# Patient Record
Sex: Male | Born: 1995 | Race: White | Hispanic: No | Marital: Single | Smoking: Never smoker
Health system: Southern US, Community
[De-identification: ages and names within clinical notes are randomized; demographics above are authoritative.]

---
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
## 2015-03-12 ENCOUNTER — Encounter (HOSPITAL_COMMUNITY): Payer: Self-pay | Admitting: Emergency Medicine

## 2015-03-12 ENCOUNTER — Emergency Department (HOSPITAL_COMMUNITY)
Admission: EM | Admit: 2015-03-12 | Discharge: 2015-03-12 | Disposition: A | Discharge status: Home / Self Care | Payer: Self-pay | Source: Home / Self Care | Attending: Family Medicine | Admitting: Family Medicine

## 2015-03-12 DIAGNOSIS — M7551 Bursitis of right shoulder: Secondary | ICD-10-CM

## 2015-03-12 MED ORDER — IBUPROFEN 600 MG PO TABS: 600.0000 mg | ORAL_TABLET | Freq: Three times a day (TID) | ORAL | Status: DC

## 2015-03-12 NOTE — ED Provider Notes (Signed)
CSN: [id number]     Arrival date & time 03/12/15  1014 History   First MD Initiated Contact with Patient 03/12/15 1142     Chief Complaint  Patient presents with  . Shoulder Pain   (Consider location/radiation/quality/duration/timing/severity/associated sxs/prior Treatment) HPI Comments: Unemployed Otherwise healthy Has tried occasional dose of ibuprofen with some relief PCP: [facility]. Has not been evaluated by PCP Spends 1-2 hours each day playing basketball either at home or with league.   Patient is a 19 y.o. male presenting with shoulder pain. The history is provided by the patient.  Shoulder Pain Location:  Shoulder Time since incident:  3 weeks Injury: no   Shoulder location:  R shoulder Chronicity:  New Handedness:  Right-handed Dislocation: no   Prior injury to area:  No Ineffective treatments:  None tried Associated symptoms comment:  None   History reviewed. No pertinent past medical history. History reviewed. No pertinent past surgical history. No family history on file. History  Substance Use Topics  . Smoking status: Never Smoker   . Smokeless tobacco: Not on file  . Alcohol Use: No    Review of Systems  All other systems reviewed and are negative.   Allergies  Review of patient's allergies indicates no known allergies.  Home Medications   Prior to Admission medications   Medication Sig Start Date End Date Taking? Authorizing Provider  ibuprofen (ADVIL,MOTRIN) 600 MG tablet Take 1 tablet (600 mg total) by mouth 3 (three) times daily. With meals for the next 7 days and then every 8 hours as needed for pain 03/12/15   Jess BartersJennifer Lee H Presson, PA   BP 118/79 mmHg  Pulse 88  Temp(Src) 98.6 F (37 C) (Oral)  Resp 22  SpO2 99% Physical Exam  Constitutional: He is oriented to person, place, and time. He appears well-developed and well-nourished. No distress.  HENT:  Head: Normocephalic and atraumatic.  Eyes: Conjunctivae are normal. No scleral icterus.   Neck: Normal range of motion. Neck supple.  Cardiovascular: Normal rate, regular rhythm and normal heart sounds.   Pulmonary/Chest: Effort normal and breath sounds normal. No respiratory distress. He has no wheezes.  Musculoskeletal: Normal range of motion.       Right shoulder: He exhibits tenderness. He exhibits normal range of motion, no bony tenderness, no swelling, no effusion, no crepitus, no deformity, no laceration, no spasm, normal pulse and normal strength.  +point tenderness at anterior bursa +mild tenderness with overhead reach and lateral abduction CSM exam of RUE intact  Neurological: He is alert and oriented to person, place, and time.  Skin: Skin is warm and dry. No rash noted. No erythema.  Psychiatric: He has a normal mood and affect. His behavior is normal.  Nursing note and vitals reviewed.   ED Course  Procedures (including critical care time) Labs Review Labs Reviewed - No data to display  Imaging Review No results found.   MDM   1. Shoulder bursitis, right    Mild anterior shoulder bursitis. Likely self limited overuse injury that will improve/resolve with RICE and NSAID therapy. Advised using these therapies for 7-10 days and then seeking follow up with PCP if symptoms do not improve.     Ria ClockJennifer Lee H Presson, GeorgiaPA 03/12/15 1300

## 2015-03-12 NOTE — Discharge Instructions (Signed)
Repetitive Strain Injuries  Repetitive strain injuries (RSIs) result from overuse or misuse of soft tissues including muscles, tendons, or nerves. Tendons are the cord-like structures that attach muscles to bones. RSIs can affect almost any part of the body. However, RSIs are most common in the arms (thumbs, wrists, elbows, shoulders) and legs (ankles, knees). Common medical conditions that are often caused by repetitive strain include carpal tunnel syndrome, tennis or golfer's elbow, bursitis, and tendonitis. If RSIs are treated early, and therepeated activity is reduced or removed, the severity and length of your problems can usually be reduced. RSIs are also called cumulative trauma disorders (CTD).   CAUSES   Many RSIs occur due to repeating the same activity at work over weeks or months without sufficient rest, such as prolonged typing. RSIs also commonly occur when a hobby or sport is done repeatedly without sufficient rest. RSIs can also occur due to repeated strain or stress on a body part in someone who has one or more risk factors for RSIs.  RISK FACTORS  Workplace risk factors   Frequent computer use, especially if your workstation is not adjusted for your body type.   Infrequent rest breaks.   Working in a high-pressure environment.   Working at a fast pace.   Repeating the same motion, such as frequent typing.   Working in an awkward position or holding the same position for a long time.   Forceful movements such as lifting, pulling, or pushing.   Vibration caused by using power tools.   Working in cold temperatures.   Job stress.  Personal risk factors   Poor posture.   Being loose-jointed.   Not exercising regularly.   Being overweight.   Arthritis, diabetes, thyroid problems, or other long-term (chronic)medical conditions.   Vitamin deficiencies.   Keeping your fingernails long.   An unhealthy, stressful, or inactive lifestyle.   Not sleeping well.  SYMPTOMS   Symptoms often  begin at work but become more noticeable after the repeated stress has ended. For example, you may develop fatigue or soreness in your wrist while typingat work, and at night you may develop numbness and tingling in your fingers. Common symptoms include:    Burning, shooting, or aching pain, especially in the fingers, palms, wrists, forearms, or shoulders.   Tenderness.   Swelling.   Tingling, numbness, or loss of feeling.   Pain with certain activities, such as turning a doorknob or reaching above your head.   Weakness, heaviness, or loss of coordination in yourhand.   Muscle spasms or tightness.  In some cases, symptoms can become so intense that it is difficult to perform everyday tasks. Symptoms that do not improve with rest may indicate a more serious condition.   DIAGNOSIS   Your caregiver may determine the type ofRSI you have based on your medical evaluation and a description of your activities.   TREATMENT   Treatment depends on the severity and type of RSI you have. Your caregiver may recommend rest for the affected body part, medicines, and physical or occupational therapy to reduce pain, swelling, and soreness. Discuss the activities you do repeatedly with your caregiver. Your caregiver can help you decide whether you need to change your activities. An RSI may take months or years to heal, especially if the affected body part gets insufficient rest. In some cases, such as severe carpal tunnel syndrome, surgery may be recommended.  PREVENTION   Talk with your supervisor to make sure you have the proper equipment   cushion in the curve of your lower back.  Shoulders and arms relaxed and at your sides.  Neck relaxed and not bent forwards or backwards.  Your desk and computer workstation  properly adjusted to your body type.  Your chair adjusted so there is no excess pressure on the back of your thighs.  The keyboard resting above your thighs. You should be able to reach the keys with your elbows at your side, bent at a right angle. Your arms should be supported on forearm rests, with your forearms parallel to the ground.  The computer mouse within easy reach.  The monitor directly in front of you, so that your eyes are aligned with the top of the screen. The screen should be about 15 to 25 inches from your eyes.  While typing, keep your wrist straight, in a neutral position. Move your entire arm when you move your mouse or when typing hard-to-reach keys.  Only use your computer as much as you need to for work. Do not use it during breaks.  Take breaks often from any repeated activity. Alternate with another task which requires you to use different muscles, or rest at least once every hour.  Change positions regularly. If you spend a lot of time sitting, get up, walk around, and stretch.  Do not hold pens or pencils tightly when writing.  Exercise regularly.  Maintain a normal weight.  Eat a diet with plenty of vegetables, whole grains, and fruit.  Get sufficient, restful sleep. HOME CARE INSTRUCTIONS  If your caregiver prescribed medicine to help reduce swelling, take it as directed.  Only take over-the-counter or prescription medicines for pain, discomfort, or fever as directed by your caregiver.  Reduce, and if needed, stopthe activities that are causing your problems until you have no further symptoms.If your symptoms are work-related, you may need to talk to your supervisor about changing your activities.  When symptoms develop, put ice or a cold pack on the aching area.  Put ice in a plastic bag.  Place a towel between your skin and the bag.  Leave the ice on for 15-20 minutes.  If you were given a splint to keep your wrist from bending, wear it as  instructed. It is important to wear the splint at night. Use the splint for as long as your caregiver recommends. SEEK MEDICAL CARE IF:  You develop new problems.  Your problems do not get better with medicine. MAKE SURE YOU:  Understand these instructions.  Will watch your condition.  Will get help right away if you are not doing well or get worse. Document Released: 10/22/2002 Document Revised: 05/02/2012 Document Reviewed: 12/23/2011 [facility] Patient Information 2015 [place], [place]. This information is not intended to replace advice given to you by your health care provider. Make sure you discuss any questions you have with your health care provider.  Rotator Cuff Tendinitis  Rotator cuff tendinitis is inflammation of the tough, cord-like bands that connect muscle to bone (tendons) in your rotator cuff. Your rotator cuff is the collection of all the muscles and tendons that connect your arm to your shoulder. Your rotator cuff holds the head of your upper arm bone (humerus) in the cup (fossa) of your shoulder blade (scapula). CAUSES Rotator cuff tendinitis is usually caused by overusing the joint involved.  SIGNS AND SYMPTOMS  Deep ache in the shoulder also felt on the outside upper arm over the shoulder muscle.  Point tenderness over the area that is injured.  Pain  comes on gradually and becomes worse with lifting the arm to the side (abduction) or turning it inward (internal rotation).  May lead to a chronic tear: When a rotator cuff tendon becomes inflamed, it runs the risk of losing its blood supply, causing some tendon fibers to die. This increases the risk that the tendon can fray and partially or completely tear. DIAGNOSIS Rotator cuff tendinitis is diagnosed by taking a medical history, performing a physical exam, and reviewing results of imaging exams. The medical history is useful to help determine the type of rotator cuff injury. The physical exam will include looking at  the injured shoulder, feeling the injured area, and watching you do range-of-motion exercises. X-ray exams are typically done to rule out other causes of shoulder pain, such as fractures. MRI is the imaging exam usually used for significant shoulder injuries. Sometimes a dye study called CT arthrogram is done, but it is not as widely used as MRI. In some institutions, special ultrasound tests may also be used to aid in the diagnosis. TREATMENT  Less Severe Cases  Use of a sling to rest the shoulder for a short period of time. Prolonged use of the sling can cause stiffness, weakness, and loss of motion of the shoulder joint.  Anti-inflammatory medicines, such as ibuprofen or naproxen sodium, may be prescribed. More Severe Cases  Physical therapy.  Use of steroid injections into the shoulder joint.  Surgery. HOME CARE INSTRUCTIONS   Use a sling or splint until the pain decreases. Prolonged use of the sling can cause stiffness, weakness, and loss of motion of the shoulder joint.  Apply ice to the injured area:  Put ice in a plastic bag.  Place a towel between your skin and the bag.  Leave the ice on for 20 minutes, 2-3 times a day.  Try to avoid use other than gentle range of motion while your shoulder is painful. Use the shoulder and exercise only as directed by your health care provider. Stop exercises or range of motion if pain or discomfort increases, unless directed otherwise by your health care provider.  Only take over-the-counter or prescription medicines for pain, discomfort, or fever as directed by your health care provider.  If you were given a shoulder sling and straps (immobilizer), do not remove it except as directed, or until you see a health care provider for a follow-up exam. If you need to remove it, move your arm as little as possible or as directed.  You may want to sleep on several pillows at night to lessen swelling and pain. SEEK IMMEDIATE MEDICAL CARE IF:    Your shoulder pain increases or new pain develops in your arm, hand, or fingers and is not relieved with medicines.  You have new, unexplained symptoms, especially increased numbness in the hands or loss of strength.  You develop any worsening of the problems that brought you in for care.  Your arm, hand, or fingers are numb or tingling.  Your arm, hand, or fingers are swollen, painful, or turn white or blue. MAKE SURE YOU:  Understand these instructions.  Will watch your condition.  Will get help right away if you are not doing well or get worse. Document Released: 01/22/2004 Document Revised: 08/22/2013 Document Reviewed: 06/13/2013 [facility] Patient Information 2015 [place], [place]. This information is not intended to replace advice given to you by your health care provider. Make sure you discuss any questions you have with your health care provider.  Bursitis Bursitis is inflammation of  a bursa. A bursa is a soft, fluid-filled sac. It cushions the soft tissue around a bone. Bursitis often occurs in the bursas near the shoulders, elbows, knees, pelvis, hips, heel, and Achilles tendon.  SYMPTOMS   Pain and tenderness in the affected area. Sometimes, pain radiates into surrounding areas. Specifically, pain with movement.  Limited range of motion of the affected joint.  Sometimes, painless swelling of the bursa.  Fever (when infected). CAUSES   Injury to a joint or bursa.  Overuse or strenuous exercise of a joint.  Gout (disease with inflamed joints).  Prolonged pressure on a joint containing bursas (resting on an elbow or kneeling).  Arthritis.  Acute or chronic infection.  Calcium deposits in shoulder tendons, with degeneration of the tendon. RISK INCREASES WITH:  Vigorous, repeated, or sudden increase in athletic training or activity level.  Failure to warm up properly.  Overstretching.  Improper exercise technique.  Playing sports on  [place]. PREVENTION  Avoid injuries or overuse of muscles.  Warm up and cool down properly. Do this before and after physical activity.  Maintain proper conditioning:  Joint flexibility.  Muscle strength and endurance.  Cardiovascular fitness.  Learn and use proper technique.  Wear protective equipment. PROGNOSIS  With proper treatment, symptoms often go away within 7 to 14 days.  RELATED COMPLICATIONS   Frequent recurrence of symptoms. This can result in a chronic, repetitive problem.  Joint stiffness.  Limited joint movement.  Infection of bursa.  Chronic inflammation or scarring of bursa. TREATMENT Treatment first involves protecting and resting the bursa and its joint. You may use ice or an elastic bandage to reduce inflammation. Anti-inflammatory medicines may help resolve the swelling. If symptoms persist despite treatment, a caregiver may withdraw fluid from the bursa. They might also consider a corticosteroid injection. Sometimes, bursitis will persist in spite of nonsurgical treatment or will become infected. These cases may require removal (surgical excision) of the bursa.  MEDICATION   If pain medicine is needed, nonsteroidal anti-inflammatory medicines, such as aspirin and ibuprofen, or other minor pain relievers, such as acetaminophen, are often recommended.  Do not take pain medicine for 7 days before surgery.  Prescription pain relievers are usually only prescribed after surgery. Use only as directed and only as much as you need.  Ointments applied to the skin may be helpful.  Corticosteroid injections may be given. This is done to reduce inflammation in the bursa. HEAT AND COLD:  Cold treatment (icing) relieves pain and reduces inflammation. Cold treatment should be applied for 10 to 15 minutes every 2 to 3 hours for inflammation and pain, and immediately after any activity that aggravates your symptoms. Use ice packs or an ice massage.  Heat  treatment may be used prior to performing the stretching and strengthening activities prescribed by your caregiver, physical therapist, or athletic trainer. Use a heat pack or a warm soak. SEEK MEDICAL CARE IF:   Symptoms get worse or do not improve in 2 weeks, despite treatment.  New, unexplained symptoms develop. (Drugs used in treatment may produce side effects.) Document Released: 11/01/2005 Document Revised: 03/18/2014 Document Reviewed: 02/13/2009 [facility] Patient Information 2015 [place], [place]. This information is not intended to replace advice given to you by your health care provider. Make sure you discuss any questions you have with your health care provider.

## 2015-03-12 NOTE — ED Notes (Signed)
C/o right shoulder pain onset 2 weeks Denies inj/trauma, numbness/tingly Pain increases in the am; last for 30 min but feels better as he gets going Alert, no signs of acute distress.

## 2018-12-07 ENCOUNTER — Encounter (HOSPITAL_COMMUNITY): Payer: Self-pay

## 2018-12-07 ENCOUNTER — Ambulatory Visit (INDEPENDENT_AMBULATORY_CARE_PROVIDER_SITE_OTHER): Payer: Self-pay

## 2018-12-07 ENCOUNTER — Other Ambulatory Visit: Payer: Self-pay

## 2018-12-07 ENCOUNTER — Ambulatory Visit (HOSPITAL_COMMUNITY)
Admission: EM | Admit: 2018-12-07 | Discharge: 2018-12-07 | Disposition: A | Discharge status: Home / Self Care | Payer: Self-pay | Attending: Family Medicine | Admitting: Family Medicine

## 2018-12-07 DIAGNOSIS — S61215A Laceration without foreign body of left ring finger without damage to nail, initial encounter: Secondary | ICD-10-CM

## 2018-12-07 MED ORDER — TETANUS-DIPHTH-ACELL PERTUSSIS 5-2.5-18.5 LF-MCG/0.5 IM SUSP
INTRAMUSCULAR | Status: AC
  Filled 2018-12-07: qty 0.5

## 2018-12-07 MED ORDER — TETANUS-DIPHTH-ACELL PERTUSSIS 5-2.5-18.5 LF-MCG/0.5 IM SUSP
0.5000 mL | Freq: Once | INTRAMUSCULAR | Status: AC
  Administered 2018-12-07: 0.5 mL via INTRAMUSCULAR

## 2018-12-07 NOTE — ED Triage Notes (Signed)
Pt cc he was drilling a screw and it went threw his left hand ring finger . Pt states this happened today.

## 2018-12-07 NOTE — ED Provider Notes (Signed)
[facility]    CSN: [id number] Arrival date & time: 12/07/18  1202     History   Chief Complaint Chief Complaint  Patient presents with  . Laceration    HPI Conni SlipperBenjamin Mcalpine is a 23 y.o. male.   Patient is a 23 year old male that presents with left ring finger injury that occurred today.  This occurred while he was doing a screw and it punctured the tip of his left ring finger.  Symptoms have been constant.  He has placed a bandage on the finger to control the bleeding.  He has since had bleeding and pain from the area.  He denies any loss of sensation and has good range of motion of the finger.      History reviewed. No pertinent past medical history.  There are no active problems to display for this patient.   History reviewed. No pertinent surgical history.     Home Medications    Prior to Admission medications   Medication Sig Start Date End Date Taking? Authorizing Provider  ibuprofen (ADVIL,MOTRIN) 600 MG tablet Take 1 tablet (600 mg total) by mouth 3 (three) times daily. With meals for the next 7 days and then every 8 hours as needed for pain 03/12/15   Presson, Mathis FareJennifer Lee H, PA    Family History No family history on file.  Social History Social History   Tobacco Use  . Smoking status: Never Smoker  . Smokeless tobacco: Never Used  Substance Use Topics  . Alcohol use: No  . Drug use: No     Allergies   Patient has no known allergies.   Review of Systems Review of Systems  Skin: Positive for wound. Negative for color change and pallor.  Neurological: Negative for weakness and numbness.  Hematological: Negative for adenopathy. Does not bruise/bleed easily.     Physical Exam Triage Vital Signs ED Triage Vitals  Enc Vitals Group     BP 12/07/18 1300 105/71     Pulse Rate 12/07/18 1300 91     Resp 12/07/18 1300 16     Temp 12/07/18 1300 99.2 F (37.3 C)     Temp Source 12/07/18 1300 Oral     SpO2 --      Weight 12/07/18 1259  135 lb (61.2 kg)     Height --      Head Circumference --      Peak Flow --      Pain Score 12/07/18 1259 10     Pain Loc --      Pain Edu? --      Excl. in GC? --    No data found.  Updated Vital Signs BP 105/71 (BP Location: Right Arm)   Pulse 91   Temp 99.2 F (37.3 C) (Oral)   Resp 16   Wt 135 lb (61.2 kg)   Visual Acuity Right Eye Distance:   Left Eye Distance:   Bilateral Distance:    Right Eye Near:   Left Eye Near:    Bilateral Near:     Physical Exam Vitals signs and nursing note reviewed.  Constitutional:      Appearance: He is well-developed.  HENT:     Head: Normocephalic and atraumatic.  Eyes:     Conjunctiva/sclera: Conjunctivae normal.  Neck:     Musculoskeletal: Neck supple.  Cardiovascular:     Rate and Rhythm: Normal rate and regular rhythm.     Heart sounds: No murmur.  Pulmonary:  Effort: Pulmonary effort is normal. No respiratory distress.     Breath sounds: Normal breath sounds.  Abdominal:     Palpations: Abdomen is soft.     Tenderness: There is no abdominal tenderness.  Musculoskeletal:     Comments: Laceration surrounding tip of the left ring finger, circling the nailbed without nail damage No obvious foreign body Sensation intact   Skin:    General: Skin is warm and dry.  Neurological:     Mental Status: He is alert.  Psychiatric:        Mood and Affect: Mood normal.      UC Treatments / Results  Labs (all labs ordered are listed, but only abnormal results are displayed) Labs Reviewed - No data to display  EKG None  Radiology Dg Hand Complete Left  Result Date: 12/07/2018 CLINICAL DATA:  Penetrating injury to fourth digit EXAM: LEFT HAND - COMPLETE 3+ VIEW COMPARISON:  None. FINDINGS: Frontal, oblique, and lateral views were obtained. There is a bandage overlying the fourth digit. Beyond the bandage, there is no radiopaque foreign body. There is no fracture or dislocation. Joint spaces appear normal. No erosive  change. No bony destruction. IMPRESSION: No radiopaque foreign body beyond overlying bandage at the fourth digit. No fracture or dislocation. No evident arthropathy. Electronically Signed   By: Bretta Bang III M.D.   On: 12/07/2018 13:55    Procedures Laceration Repair Date/Time: 12/07/2018 5:33 PM Performed by: Janace Aris, NP Authorized by: Janace Aris, NP   Consent:    Consent obtained:  Verbal   Consent given by:  Patient   Risks discussed:  Infection, need for additional repair, pain, poor cosmetic result and poor wound healing   Alternatives discussed:  No treatment and delayed treatment Universal protocol:    Patient identity confirmed:  Verbally with patient Anesthesia (see MAR for exact dosages):    Anesthesia method:  Local infiltration   Local anesthetic:  Lidocaine 2% w/o epi Laceration details:    Location:  Finger   Finger location:  L ring finger Repair type:    Repair type:  Intermediate Pre-procedure details:    Preparation:  Patient was prepped and draped in usual sterile fashion Exploration:    Hemostasis achieved with:  Direct pressure   Wound extent: no foreign bodies/material noted, no muscle damage noted, no nerve damage noted, no tendon damage noted and no underlying fracture noted     Contaminated: no   Treatment:    Area cleansed with:  Betadine and saline   Amount of cleaning:  Extensive   Irrigation solution:  Sterile saline   Irrigation method:  Pressure wash   Visualized foreign bodies/material removed: no   Skin repair:    Repair method:  Sutures   Suture size:  4-0   Suture material:  Prolene   Suture technique:  Simple interrupted   Number of sutures:  3 Approximation:    Approximation:  Close Post-procedure details:    Dressing:  Bulky dressing   Patient tolerance of procedure:  Tolerated well, no immediate complications   (including critical care time)  Medications Ordered in UC Medications  Tdap (BOOSTRIX) injection 0.5  mL (0.5 mLs Intramuscular Given 12/07/18 1444)    Initial Impression / Assessment and Plan / UC Course  I have reviewed the triage vital signs and the nursing notes.  Pertinent labs & imaging results that were available during my care of the patient were reviewed by me and considered in my medical  decision making (see chart for details).     X-ray negative for any fractures Wound thoroughly cleaned and 3 sutures placed in the tip of the finger Tetanus updated Patient tolerated procedure well Wrapped with bulky dressing Will have patient follow-up in 7 to 10 days for suture removal Final Clinical Impressions(s) / UC Diagnoses   Final diagnoses:  Laceration of left ring finger without foreign body without damage to nail, initial encounter     Discharge Instructions     We put 3 stiches in the finger Please return before you go out of town to have the sutures removed.  Tetanus updated here today.  Keep area clean and dry and watch for signs of infection to include severe redness, swelling and drainage Follow up as needed for continued or worsening symptoms     ED Prescriptions    None     Controlled Substance Prescriptions [place] Controlled Substance Registry consulted? No   Janace ArisBast, Aedon Deason A, NP 12/07/18 1734

## 2018-12-07 NOTE — Discharge Instructions (Addendum)
We put 3 stiches in the finger Please return before you go out of town to have the sutures removed.  Tetanus updated here today.  Keep area clean and dry and watch for signs of infection to include severe redness, swelling and drainage Follow up as needed for continued or worsening symptoms

## 2019-11-23 ENCOUNTER — Ambulatory Visit: Payer: Self-pay

## 2020-03-19 IMAGING — DX DG HAND COMPLETE 3+V*L*
3 series · 3 of 3 positions shown · non-contrast
Comparison: None.

CLINICAL DATA: Penetrating injury to fourth digit

EXAM:
LEFT HAND - COMPLETE 3+ VIEW

[hand pa]
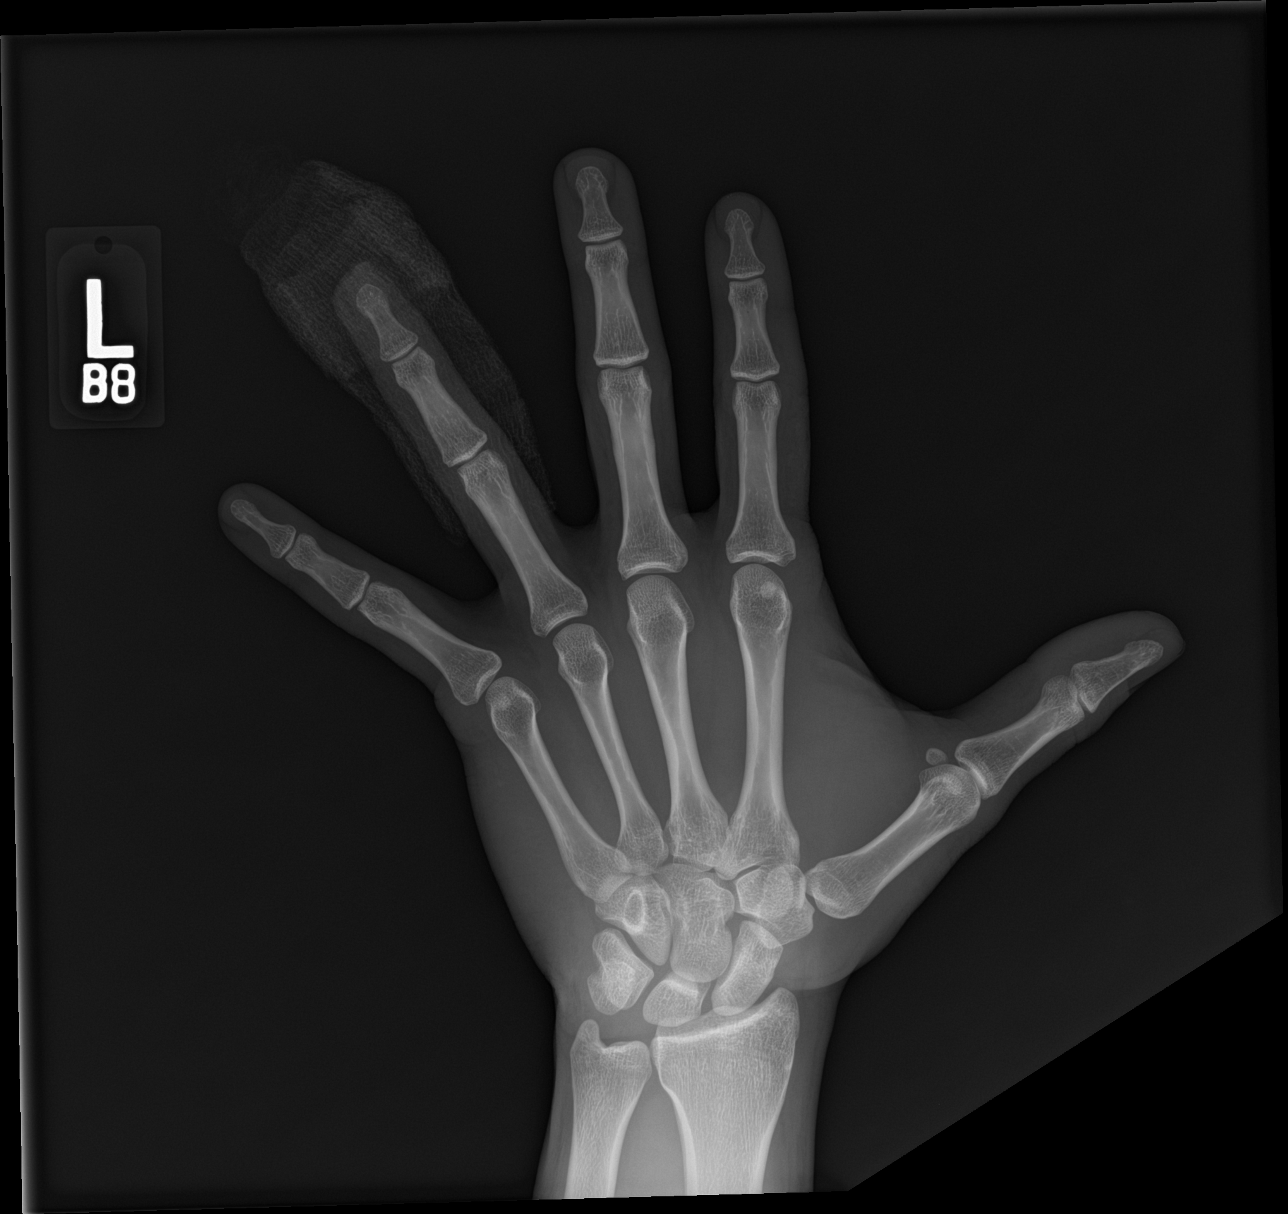

[hand obl]
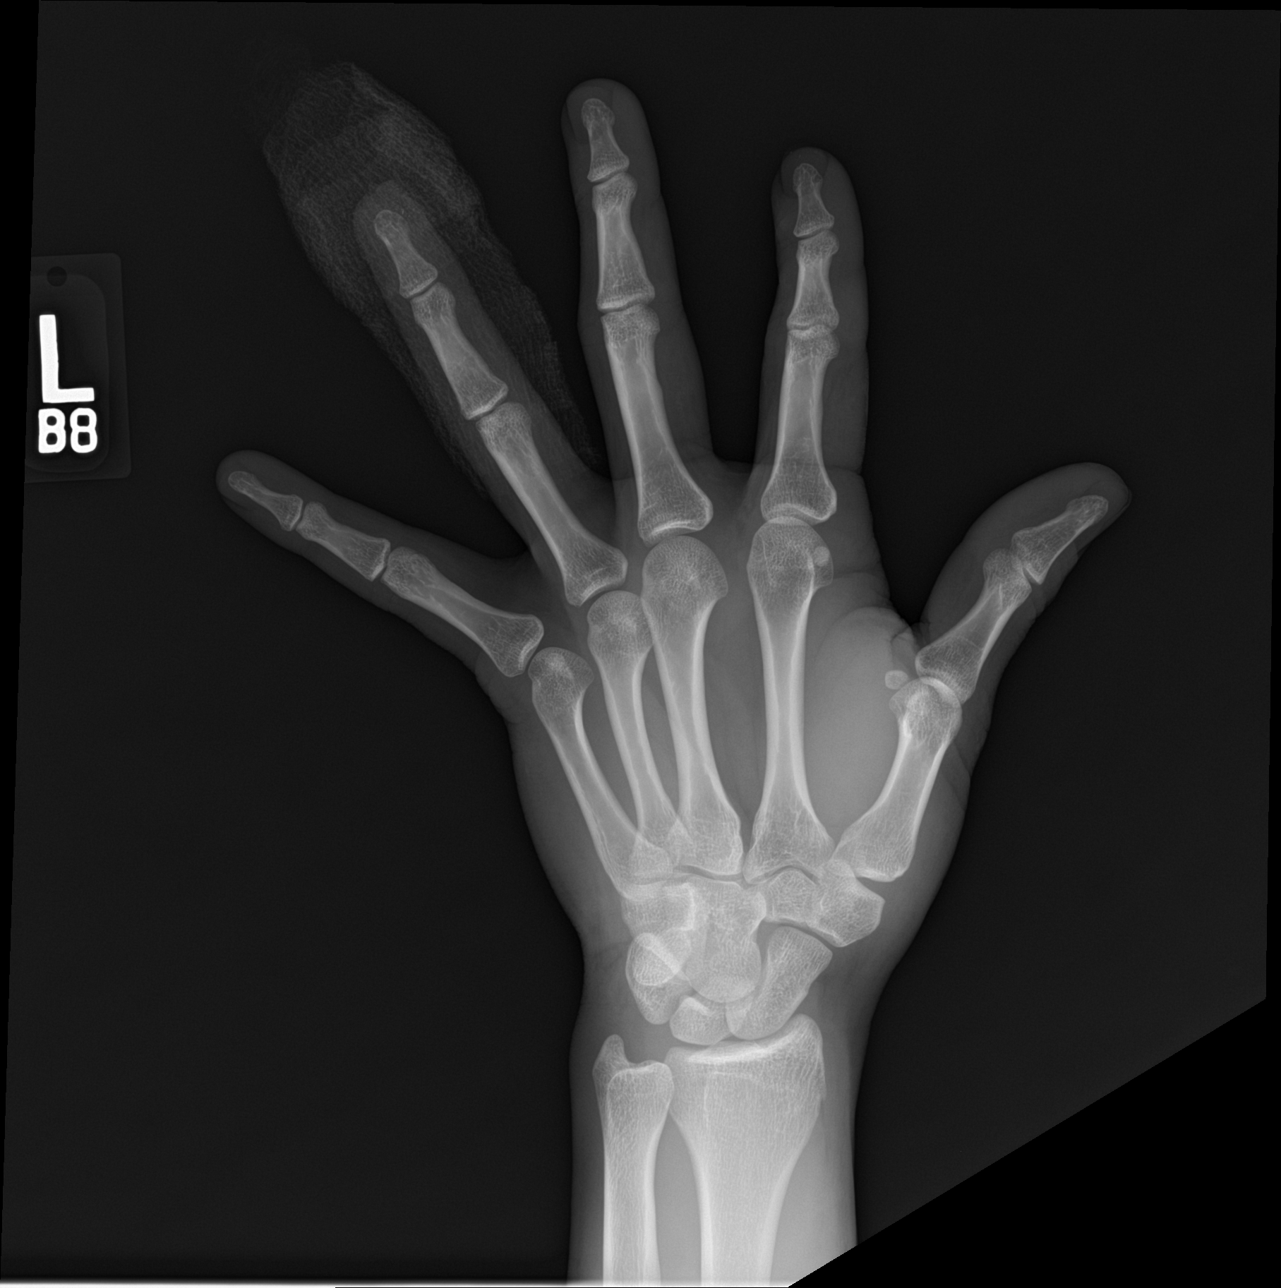

[hand lat]
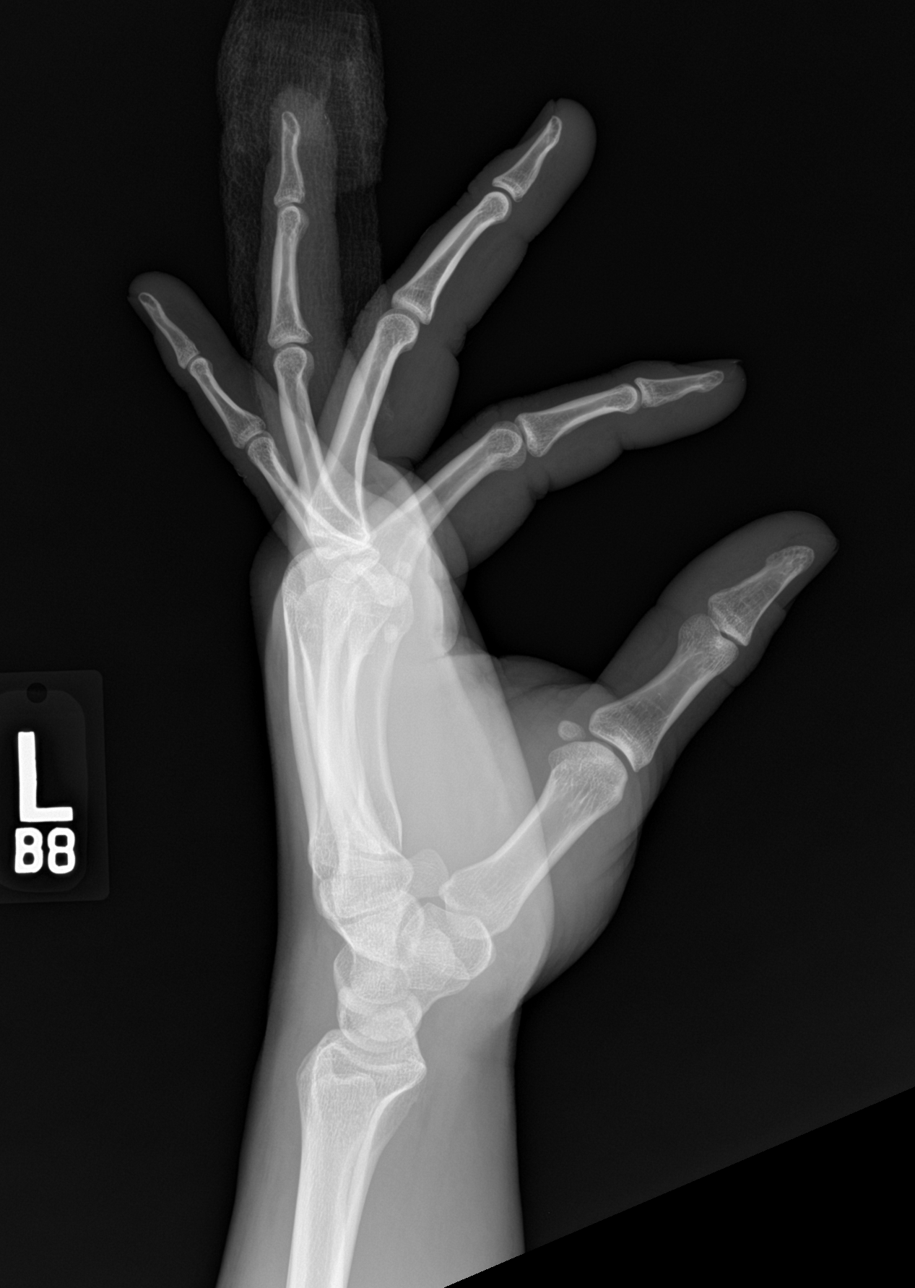

[3 of 3 positions shown; findings below may reference images not displayed]

FINDINGS: Frontal, oblique, and lateral views were obtained. There is a
bandage overlying the fourth digit. Beyond the bandage, there is no
radiopaque foreign body.

There is no fracture or dislocation. Joint spaces appear normal. No
erosive change. No bony destruction.
IMPRESSION: No radiopaque foreign body beyond overlying bandage at the fourth
digit. No fracture or dislocation. No evident arthropathy.

## 2021-04-15 ENCOUNTER — Other Ambulatory Visit: Payer: Self-pay

## 2021-04-15 ENCOUNTER — Ambulatory Visit: Payer: 59 | Admitting: Physician Assistant

## 2021-04-15 VITALS — BP 144/92 | HR 101 | Temp 98.0°F | Ht 63.0 in | Wt 135.0 lb

## 2021-04-15 DIAGNOSIS — R197 Diarrhea, unspecified: Secondary | ICD-10-CM

## 2021-04-15 DIAGNOSIS — R1084 Generalized abdominal pain: Secondary | ICD-10-CM | POA: Diagnosis not present

## 2021-04-15 DIAGNOSIS — I10 Essential (primary) hypertension: Secondary | ICD-10-CM

## 2021-04-15 DIAGNOSIS — R112 Nausea with vomiting, unspecified: Secondary | ICD-10-CM | POA: Diagnosis not present

## 2021-04-15 DIAGNOSIS — F411 Generalized anxiety disorder: Secondary | ICD-10-CM

## 2021-04-15 DIAGNOSIS — Z6823 Body mass index (BMI) 23.0-23.9, adult: Secondary | ICD-10-CM

## 2021-04-15 DIAGNOSIS — J029 Acute pharyngitis, unspecified: Secondary | ICD-10-CM

## 2021-04-15 DIAGNOSIS — F439 Reaction to severe stress, unspecified: Secondary | ICD-10-CM

## 2021-04-15 DIAGNOSIS — N3000 Acute cystitis without hematuria: Secondary | ICD-10-CM

## 2021-04-15 LAB — POC COVID19 BINAXNOW: SARS Coronavirus 2 Ag: NEGATIVE

## 2021-04-15 LAB — POCT URINALYSIS DIP (CLINITEK)
Bilirubin, UA: NEGATIVE
Blood, UA: NEGATIVE
Glucose, UA: NEGATIVE mg/dL
Nitrite, UA: POSITIVE — AB
Spec Grav, UA: 1.02 (ref 1.010–1.025)
Urobilinogen, UA: 0.2 E.U./dL
pH, UA: 7 (ref 5.0–8.0)

## 2021-04-15 LAB — POCT GLYCOSYLATED HEMOGLOBIN (HGB A1C)

## 2021-04-15 MED ORDER — HYDROXYZINE HCL 10 MG PO TABS: 10.0000 mg | ORAL_TABLET | Freq: Three times a day (TID) | ORAL | 0 refills | Status: AC | PRN

## 2021-04-15 MED ORDER — NITROFURANTOIN MONOHYD MACRO 100 MG PO CAPS: 100.0000 mg | ORAL_CAPSULE | Freq: Two times a day (BID) | ORAL | 0 refills | Status: AC

## 2021-04-15 NOTE — Patient Instructions (Signed)
For your UTI, you will take Macrobid twice a day for 5 days.  I encourage you to increase your water intake.  For your nausea and diarrhea, I encourage you to eat a gentle diet.  Your blood pressure was elevated today, I encourage you to check your blood pressure at home, keep a written log and have available for all office visits.  I do encourage you to establish with a primary care provider as soon as possible.  Please feel free to return to the mobile unit if your blood pressure readings remain elevated.  For your elevated stress and anxiety, I encourage you to try hydroxyzine 10 mg every 8 hours as needed.  I also encourage you to work on stress reducing techniques.  I have started a referral for you to be seen for counseling.  Please let us know if there is anything else we can do for you.  Kennieth Rad, PA-C Physician Assistant [facility] [email or web address]    Urinary Tract Infection, Adult  A urinary tract infection (UTI) is an infection of any part of the urinary tract. The urinary tract includes the kidneys, ureters, bladder, and urethra. These organs make, store, and get rid of urine in the body. An upper UTI affects the ureters and kidneys. A lower UTI affects the bladder and urethra. What are the causes? Most urinary tract infections are caused by bacteria in your genital area around your urethra, where urine leaves your body. These bacteria grow and cause inflammation of your urinary tract. What increases the risk? You are more likely to develop this condition if:  You have a urinary catheter that stays in place.  You are not able to control when you urinate or have a bowel movement (incontinence).  You are male and you: ? Use a spermicide or diaphragm for birth control. ? Have low estrogen levels. ? Are pregnant.  You have certain genes that increase your risk.  You are sexually active.  You take  antibiotic medicines.  You have a condition that causes your flow of urine to slow down, such as: ? An enlarged prostate, if you are male. ? Blockage in your urethra. ? A kidney stone. ? A nerve condition that affects your bladder control (neurogenic bladder). ? Not getting enough to drink, or not urinating often.  You have certain medical conditions, such as: ? Diabetes. ? A weak disease-fighting system (immunesystem). ? Sickle cell disease. ? Gout. ? Spinal cord injury. What are the signs or symptoms? Symptoms of this condition include:  Needing to urinate right away (urgency).  Frequent urination. This may include small amounts of urine each time you urinate.  Pain or burning with urination.  Blood in the urine.  Urine that smells bad or unusual.  Trouble urinating.  Cloudy urine.  Vaginal discharge, if you are male.  Pain in the abdomen or the lower back. You may also have:  Vomiting or a decreased appetite.  Confusion.  Irritability or tiredness.  A fever or chills.  Diarrhea. The first symptom in older adults may be confusion. In some cases, they may not have any symptoms until the infection has worsened. How is this diagnosed? This condition is diagnosed based on your medical history and a physical exam. You may also have other tests, including:  Urine tests.  Blood tests.  Tests for STIs (sexually transmitted infections). If you have had more than one UTI, a cystoscopy or imaging studies may be done to determine the cause  of the infections. How is this treated? Treatment for this condition includes:  Antibiotic medicine.  Over-the-counter medicines to treat discomfort.  Drinking enough water to stay hydrated. If you have frequent infections or have other conditions such as a kidney stone, you may need to see a health care provider who specializes in the urinary tract (urologist). In rare cases, urinary tract infections can cause sepsis.  Sepsis is a life-threatening condition that occurs when the body responds to an infection. Sepsis is treated in the hospital with IV antibiotics, fluids, and other medicines. Follow these instructions at home: Medicines  Take over-the-counter and prescription medicines only as told by your health care provider.  If you were prescribed an antibiotic medicine, take it as told by your health care provider. Do not stop using the antibiotic even if you start to feel better. General instructions  Make sure you: ? Empty your bladder often and completely. Do not hold urine for long periods of time. ? Empty your bladder after sex. ? Wipe from front to back after urinating or having a bowel movement if you are male. Use each tissue only one time when you wipe.  Drink enough fluid to keep your urine pale yellow.  Keep all follow-up visits. This is important.   Contact a health care provider if:  Your symptoms do not get better after 1-2 days.  Your symptoms go away and then return. Get help right away if:  You have severe pain in your back or your lower abdomen.  You have a fever or chills.  You have nausea or vomiting. Summary  A urinary tract infection (UTI) is an infection of any part of the urinary tract, which includes the kidneys, ureters, bladder, and urethra.  Most urinary tract infections are caused by bacteria in your genital area.  Treatment for this condition often includes antibiotic medicines.  If you were prescribed an antibiotic medicine, take it as told by your health care provider. Do not stop using the antibiotic even if you start to feel better.  Keep all follow-up visits. This is important. This information is not intended to replace advice given to you by your health care provider. Make sure you discuss any questions you have with your health care provider. Document Revised: 06/13/2020 Document Reviewed: 06/13/2020 Elsevier Patient Education  2021 [place], Adult Feeling a certain amount of stress is normal. Stress helps our body and mind get ready to deal with the demands of life. Stress hormones can motivate you to do well at work and meet your responsibilities. However severe or long-lasting (chronic) stress can affect your mental and physical health. Chronic stress puts you at higher risk for anxiety, depression, and other health problems like digestive problems, muscle aches, heart disease, high blood pressure, and stroke. What are the causes? Common causes of stress include:  Demands from work, such as deadlines, feeling overworked, or having long hours.  Pressures at home, such as money issues, disagreements with a spouse, or parenting issues.  Pressures from major life changes, such as divorce, moving, loss of a loved one, or chronic illness. You may be at higher risk for stress-related problems if you do not get enough sleep, are in poor health, do not have emotional support, or have a mental health disorder like anxiety or depression. How to recognize stress Stress can make you:  Have trouble sleeping.  Feel sad, anxious, irritable, or overwhelmed.  Lose your appetite.  Overeat or want to  eat unhealthy foods.  Want to use drugs or alcohol. Stress can also cause physical symptoms, such as:  Sore, tense muscles, especially in the shoulders and neck.  Headaches.  Trouble breathing.  A faster heart rate.  Stomach pain, nausea, or vomiting.  Diarrhea or constipation.  Trouble concentrating. Follow these instructions at home: Lifestyle  Identify the source of your stress and your reaction to it. See a therapist who can help you change your reactions.  When there are stressful events: ? Talk about it with family, friends, or co-workers. ? Try to think realistically about stressful events and not ignore them or overreact. ? Try to find the positives in a stressful situation and not focus on the  negatives. ? Cut back on responsibilities at work and home, if possible. Ask for help from friends or family members if you need it.  Find ways to cope with stress, such as: ? Meditation. ? Deep breathing. ? Yoga or tai chi. ? Progressive muscle relaxation. ? Doing art, playing music, or reading. ? Making time for fun activities. ? Spending time with family and friends.  Get support from family, friends, or spiritual resources. Eating and drinking  Eat a healthy diet. This includes: ? Eating foods that are high in fiber, such as beans, whole grains, and fresh fruits and vegetables. ? Limiting foods that are high in fat and processed sugars, such as fried and sweet foods.  Do not skip meals or overeat.  Drink enough fluid to keep your urine pale yellow. Alcohol use  Do not drink alcohol if: ? Your health care provider tells you not to drink. ? You are pregnant, may be pregnant, or are planning to become pregnant.  Drinking alcohol is a way some people try to ease their stress. This can be dangerous, so if you drink alcohol: ? Limit how much you use to:  0-1 drink a day for women.  0-2 drinks a day for men. ? Be aware of how much alcohol is in your drink. In the [place] one drink equals one 12 oz bottle of beer (355 mL), one 5 oz glass of wine (148 mL), or one 1 oz glass of hard liquor (44 mL). Activity  Include 30 minutes of exercise in your daily schedule. Exercise is a good stress reducer.  Include time in your day for an activity that you find relaxing. Try taking a walk, going on a bike ride, reading a book, or listening to music.  Schedule your time in a way that lowers stress, and keep a consistent schedule. Prioritize what is most important to get done.   General instructions  Get enough sleep. Try to go to sleep and get up at about the same time every day.  Take over-the-counter and prescription medicines only as told by your health care provider.  Do not use any  products that contain nicotine or tobacco, such as cigarettes, e-cigarettes, and chewing tobacco. If you need help quitting, ask your health care provider.  Do not use drugs or smoke to cope with stress.  Keep all follow-up visits as told by your health care provider. This is important. Where to find support  Talk with your health care provider about stress management or finding a support group.  Find a therapist to work with you on your stress management techniques. Contact a health care provider if:  Your stress symptoms get worse.  You are unable to manage your stress at home.  You are struggling to stop  using drugs or alcohol. Get help right away if:  You may be a danger to yourself or others.  You have any thoughts of death or suicide. If you ever feel like you may hurt yourself or others, or have thoughts about taking your own life, get help right away. You can go to your nearest emergency department or call:  Your local emergency services (911 in the [place]  A suicide crisis helpline, such as the [place] at 626-823-9970. This is open 24 hours a day. Summary  Feeling a certain amount of stress is normal, but severe or long-lasting (chronic) stress can affect your mental and physical health.  Chronic stress can put you at higher risk for anxiety, depression, and other health problems like digestive problems, muscle aches, heart disease, high blood pressure, and stroke.  You may be at higher risk for stress-related problems if you do not get enough sleep, are in poor health, lack emotional support, or have a mental health disorder like anxiety or depression.  Identify the source of your stress and your reaction to it. Try talking about stressful events with family, friends, or co-workers, finding a coping method, or getting support from spiritual resources.  If you need more help, talk with your health care provider about finding a support group  or a mental health therapist. This information is not intended to replace advice given to you by your health care provider. Make sure you discuss any questions you have with your health care provider. Document Revised: 05/30/2019 Document Reviewed: 05/30/2019 Elsevier Patient Education  [place].

## 2021-04-15 NOTE — Progress Notes (Signed)
New Patient Office Visit  Subjective:  Patient ID: Richard Mcdowell, male    DOB: 07-29-1996  Age: 25 y.o. MRN: [id number]  CC:  Chief Complaint  Patient presents with  . bladder issue    HPI Richard Mcdowell states that he has been having a feeling of not emptying his bladder for the past, feels pressure in his stomach for the past 4 days, describes pain as pulling "like anxiety". Endorses nausea with one episode of vomiting this morning.  Endoreses diarrhea, last episode   Tried Azo with some relief.  States that he had irritation at tip of penis but states  that has resolved  Does endores scratchy throat and headache, but believes that it  is allergies. Reports [employer] Covid [employer] in April 2021.  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  Denies sick contacts.   Drinks approx 1 glass of water a day   Wife is pregnant, anxiety is elevated; reports that he use to smoke marijuana with relief of stress and anxiety, but has been abstaining due to wifespregnancy.   Does not check BP at home . Sleep is good, does not use anything for relief   PHQ9 -3 GAD 7 - 6  History reviewed. No pertinent past medical history.  History reviewed. No pertinent surgical history.  History reviewed. No pertinent family history.  Social History   Socioeconomic History  . Marital status: Single    Spouse name: Not on file  . Number of children: Not on file  . Years of education: Not on file  . Highest education level: Not on file  Occupational History  . Not on file  Tobacco Use  . Smoking status: Never Smoker  . Smokeless tobacco: Never Used  Substance and Sexual Activity  . Alcohol use: No  . Drug use: No  . Sexual activity: Not on file  Other Topics Concern  . Not on file  Social History Narrative  . Not on file   Social Determinants of Health   Financial Resource Strain: Not on file  Food Insecurity: Not on file  Transportation Needs: Not on file  Physical Activity:  Not on file  Stress: Not on file  Social Connections: Not on file  Intimate Partner Violence: Not on file    ROS Review of Systems  Constitutional: Negative for chills and fever.  HENT: Negative.   Eyes: Negative.   Respiratory: Negative for shortness of breath.   Cardiovascular: Negative for chest pain.  Gastrointestinal: Positive for abdominal pain, diarrhea, nausea and vomiting.  Endocrine: Negative.   Genitourinary: Positive for frequency and urgency. Negative for dysuria, genital sores, hematuria, penile discharge, penile pain, penile swelling and scrotal swelling.  Musculoskeletal: Negative.   Skin: Negative.   Allergic/Immunologic: Negative.   Neurological: Negative.   Hematological: Negative.     Objective:   Today's Vitals: BP (!) 144/92   Pulse (!) 101   Temp 98 F (36.7 C)   Ht 5\' 3"  (1.6 m)   Wt 135 lb (61.2 kg)   BMI 23.91 kg/m   Physical Exam Vitals reviewed.  Constitutional:      Appearance: Normal appearance.  HENT:     Head: Normocephalic and atraumatic.     Right Ear: External ear normal.     Left Ear: External ear normal.     Nose: Nose normal.     Mouth/Throat:     Mouth: Mucous membranes are moist.     Pharynx: Oropharynx  is clear.  Cardiovascular:     Rate and Rhythm: Regular rhythm. Tachycardia present.     Pulses: Normal pulses.     Heart sounds: Normal heart sounds.  Pulmonary:     Effort: Pulmonary effort is normal.     Breath sounds: Normal breath sounds.  Abdominal:     General: There is no distension.     Tenderness: There is no abdominal tenderness. There is left CVA tenderness. There is no right CVA tenderness.  Musculoskeletal:        General: Normal range of motion.     Cervical back: Normal range of motion and neck supple.  Skin:    General: Skin is warm and dry.  Neurological:     General: No focal deficit present.     Mental Status: He is alert.  Psychiatric:        Attention and Perception: Attention and perception  normal.        Mood and Affect: Mood and affect normal.        Speech: Speech normal.        Behavior: Behavior normal.        Thought Content: Thought content does not include homicidal or suicidal ideation.        Cognition and Memory: Cognition and memory normal.        Judgment: Judgment normal.     Assessment & Plan:   Problem List Items Addressed This Visit   None   Visit Diagnoses    Acute cystitis without hematuria    -  Primary   Relevant Medications   nitrofurantoin, macrocrystal-monohydrate, (MACROBID) 100 MG capsule   Other Relevant Orders   POCT URINALYSIS DIP (CLINITEK) (Completed)   Generalized abdominal pain       Non-intractable vomiting with nausea, unspecified vomiting type       GAD (generalized anxiety disorder)       Relevant Medications   hydrOXYzine (ATARAX/VISTARIL) 10 MG tablet   Other Relevant Orders   Ambulatory referral to Social Work   Stress       Sore throat       Relevant Orders   POC COVID-19 (Completed)   Diarrhea, unspecified type       Elevated blood pressure reading in office with diagnosis of hypertension          Outpatient Encounter Medications as of 04/15/2021  Medication Sig  . hydrOXYzine (ATARAX/VISTARIL) 10 MG tablet Take 1 tablet (10 mg total) by mouth 3 (three) times daily as needed.  . nitrofurantoin, macrocrystal-monohydrate, (MACROBID) 100 MG capsule Take 1 capsule (100 mg total) by mouth 2 (two) times daily for 5 days.  . [DISCONTINUED] ibuprofen (ADVIL,MOTRIN) 600 MG tablet Take 1 tablet (600 mg total) by mouth 3 (three) times daily. With meals for the next 7 days and then every 8 hours as needed for pain   No facility-administered encounter medications on file as of 04/15/2021.  1. Acute cystitis without hematuria UA positive for nitrates and leukocytes.  Trial Macrobid.  Encourage patient to increase hydration, plenty of rest.  Red flags given for prompt reevaluation. - nitrofurantoin, macrocrystal-monohydrate,  (MACROBID) 100 MG capsule; Take 1 capsule (100 mg total) by mouth 2 (two) times daily for 5 days.  Dispense: 10 capsule; Refill: 0 - POCT URINALYSIS DIP (CLINITEK)  2. Generalized abdominal pain   3. Non-intractable vomiting with nausea, unspecified vomiting type Patient education given on gentle diet  4. GAD (generalized anxiety disorder) Trial hydroxyzine, refer for CBT, patient  education given on stress reducing activities - hydrOXYzine (ATARAX/VISTARIL) 10 MG tablet; Take 1 tablet (10 mg total) by mouth 3 (three) times daily as needed.  Dispense: 30 tablet; Refill: 0 - Ambulatory referral to Social Work  5. Stress   6. Sore throat Rapid COVID test negative - POC COVID-19  7. Diarrhea, unspecified type   8. Elevated blood pressure reading in office with diagnosis of hypertension Patient encouraged to check blood pressure at home, keep a written log and have available for all office visits.  Red flags given for prompt reevaluation.   I have reviewed the patient's medical history (PMH, PSH, Social History, Family History, Medications, and allergies) , and have been updated if relevant. I spent 32 minutes reviewing chart and  face to face time with patient.     Follow-up: Return if symptoms worsen or fail to improve.   Kasandra Knudsen Mayers, PA-C

## 2021-04-16 ENCOUNTER — Other Ambulatory Visit: Payer: 59

## 2021-04-16 DIAGNOSIS — R1084 Generalized abdominal pain: Secondary | ICD-10-CM | POA: Insufficient documentation

## 2021-04-16 DIAGNOSIS — R197 Diarrhea, unspecified: Secondary | ICD-10-CM | POA: Insufficient documentation

## 2021-04-16 DIAGNOSIS — F439 Reaction to severe stress, unspecified: Secondary | ICD-10-CM | POA: Insufficient documentation

## 2021-04-16 DIAGNOSIS — J029 Acute pharyngitis, unspecified: Secondary | ICD-10-CM | POA: Insufficient documentation

## 2021-04-16 DIAGNOSIS — F411 Generalized anxiety disorder: Secondary | ICD-10-CM | POA: Insufficient documentation

## 2021-04-16 DIAGNOSIS — R1110 Vomiting, unspecified: Secondary | ICD-10-CM | POA: Insufficient documentation

## 2021-04-16 DIAGNOSIS — N3000 Acute cystitis without hematuria: Secondary | ICD-10-CM | POA: Insufficient documentation

## 2021-04-20 ENCOUNTER — Other Ambulatory Visit: Payer: Self-pay

## 2021-04-20 ENCOUNTER — Ambulatory Visit: Payer: 59 | Admitting: Physician Assistant

## 2021-04-20 ENCOUNTER — Ambulatory Visit: Payer: 59

## 2021-04-20 VITALS — BP 124/90 | HR 122 | Resp 18 | Ht 63.0 in | Wt 135.0 lb

## 2021-04-20 DIAGNOSIS — N3000 Acute cystitis without hematuria: Secondary | ICD-10-CM

## 2021-04-20 DIAGNOSIS — R000 Tachycardia, unspecified: Secondary | ICD-10-CM | POA: Diagnosis not present

## 2021-04-20 MED ORDER — SULFAMETHOXAZOLE-TRIMETHOPRIM 800-160 MG PO TABS: 1.0000 | ORAL_TABLET | Freq: Two times a day (BID) | ORAL | 0 refills | Status: AC

## 2021-04-20 NOTE — Progress Notes (Signed)
Patient presents for Follow up on beginning medication for stress levels post 1 week.

## 2021-04-20 NOTE — Progress Notes (Signed)
Established Patient Office Visit  Subjective:  Patient ID: Richard Mcdowell, male    DOB: 1995-12-03  Age: 25 y.o. MRN: [id number]  CC:  Chief Complaint  Patient presents with  . Dysuria    HPI Richard Mcdowell reports that he did complete the course of Macrobid, has increased his water intake as well.  Reports that he did feel like he started to get a little better, and then yesterday afternoon he started having dysuria again.  States that when he woke up this morning it was not present, but then it was present again a short time later.  Reports that he has been using the hydroxyzine on occasion to help with his anxiety.  Reports that he is currently anxious, mostly regarding his urinary symptoms.   History reviewed. No pertinent past medical history.  History reviewed. No pertinent surgical history.  History reviewed. No pertinent family history.  Social History   Socioeconomic History  . Marital status: Single    Spouse name: Not on file  . Number of children: Not on file  . Years of education: Not on file  . Highest education level: Not on file  Occupational History  . Not on file  Tobacco Use  . Smoking status: Never Smoker  . Smokeless tobacco: Never Used  Substance and Sexual Activity  . Alcohol use: No  . Drug use: No  . Sexual activity: Not on file  Other Topics Concern  . Not on file  Social History Narrative  . Not on file   Social Determinants of Health   Financial Resource Strain: Not on file  Food Insecurity: Not on file  Transportation Needs: Not on file  Physical Activity: Not on file  Stress: Not on file  Social Connections: Not on file  Intimate Partner Violence: Not on file    Outpatient Medications Prior to Visit  Medication Sig Dispense Refill  . hydrOXYzine (ATARAX/VISTARIL) 10 MG tablet Take 1 tablet (10 mg total) by mouth 3 (three) times daily as needed. 30 tablet 0  . nitrofurantoin, macrocrystal-monohydrate, (MACROBID) 100 MG capsule  Take 1 capsule (100 mg total) by mouth 2 (two) times daily for 5 days. 10 capsule 0   No facility-administered medications prior to visit.    Allergies  Allergen Reactions  . Clindamycin Rash    ROS Review of Systems  Constitutional: Negative for chills, fatigue and fever.  HENT: Negative.   Eyes: Negative.   Respiratory: Negative for shortness of breath.   Cardiovascular: Negative for chest pain and palpitations.  Gastrointestinal: Negative for abdominal pain.  Endocrine: Negative.   Genitourinary: Positive for dysuria. Negative for hematuria, penile discharge, penile swelling, testicular pain and urgency.  Musculoskeletal: Negative for back pain.  Skin: Negative.   Allergic/Immunologic: Negative.   Neurological: Negative for headaches.  Hematological: Negative.   Psychiatric/Behavioral: Negative for self-injury and suicidal ideas. The patient is nervous/anxious.       Objective:    Physical Exam Vitals reviewed.  Constitutional:      Appearance: Normal appearance.  HENT:     Head: Normocephalic and atraumatic.     Right Ear: External ear normal.     Left Ear: External ear normal.     Mouth/Throat:     Mouth: Mucous membranes are moist.     Pharynx: Oropharynx is clear.  Eyes:     Extraocular Movements: Extraocular movements intact.     Conjunctiva/sclera: Conjunctivae normal.     Pupils: Pupils are equal, round, and reactive to light.  Cardiovascular:     Rate and Rhythm: Regular rhythm. Tachycardia present.     Pulses: Normal pulses.     Heart sounds: Normal heart sounds.  Pulmonary:     Effort: Pulmonary effort is normal.     Breath sounds: Normal breath sounds.  Abdominal:     Tenderness: There is no abdominal tenderness. There is no right CVA tenderness or left CVA tenderness.  Musculoskeletal:        General: Normal range of motion.     Cervical back: Normal range of motion and neck supple.  Skin:    General: Skin is warm and dry.  Neurological:      General: No focal deficit present.     Mental Status: He is alert and oriented to person, place, and time.  Psychiatric:        Mood and Affect: Mood normal.        Behavior: Behavior normal.        Thought Content: Thought content normal.        Judgment: Judgment normal.     BP 124/90 (BP Location: Left Arm, Patient Position: Sitting, Cuff Size: Normal)   Pulse (!) 122   Resp 18   Ht '5\' 3"'  (1.6 m)   Wt 135 lb (61.2 kg)   SpO2 98%   BMI 23.91 kg/m  Wt Readings from Last 3 Encounters:  04/20/21 135 lb (61.2 kg)  04/15/21 135 lb (61.2 kg)  12/07/18 135 lb (61.2 kg)     Health Maintenance Due  Topic Date Due  . HPV VACCINES (1 - Male 2-dose series) Never done  . HIV Screening  Never done  . Hepatitis C Screening  Never done       Topic Date Due  . HPV VACCINES (1 - Male 2-dose series) Never done    No results found for: TSH No results found for: WBC, HGB, HCT, MCV, PLT No results found for: NA, K, CHLORIDE, CO2, GLUCOSE, BUN, CREATININE, BILITOT, ALKPHOS, AST, ALT, PROT, ALBUMIN, CALCIUM, ANIONGAP, EGFR, GFR No results found for: CHOL No results found for: HDL No results found for: LDLCALC No results found for: TRIG No results found for: CHOLHDL No results found for: HGBA1C    Assessment & Plan:   Problem List Items Addressed This Visit      Genitourinary   Acute cystitis without hematuria - Primary   Relevant Medications   sulfamethoxazole-trimethoprim (BACTRIM DS) 800-160 MG tablet     Other   Tachycardia    1. Acute cystitis without hematuria Trial Bactrim, continue proper hydration, trial AZO over-the-counter.  Red flags given for prompt reevaluation - sulfamethoxazole-trimethoprim (BACTRIM DS) 800-160 MG tablet; Take 1 tablet by mouth 2 (two) times daily for 7 days.  Dispense: 14 tablet; Refill: 0  2. Tachycardia Pulse did improve over course of visit, patient education given, red flags given for prompt reevaluation.   I have reviewed the  patient's medical history (PMH, PSH, Social History, Family History, Medications, and allergies) , and have been updated if relevant. I spent 20 minutes reviewing chart and  face to face time with patient.      Meds ordered this encounter  Medications  . sulfamethoxazole-trimethoprim (BACTRIM DS) 800-160 MG tablet    Sig: Take 1 tablet by mouth 2 (two) times daily for 7 days.    Dispense:  14 tablet    Refill:  0    Order Specific Question:   Supervising Provider    Answer:   Joya Gaskins,  PATRICK E [[id number]    Follow-up: Return if symptoms worsen or fail to improve.    Loraine Grip Mayers, PA-C

## 2021-04-20 NOTE — Patient Instructions (Addendum)
You will take Bactrim twice a day for 7 days.  Continue to drink lots of water and get plenty of rest.  It is okay to use AZO to help with the discomfort if needed.  Please let us know if there is anything else we can help you with   Roney Jaffe, PA-C Physician Assistant [facility] [email or web address]    Sinus Tachycardia  Sinus tachycardia is a kind of fast heartbeat. In sinus tachycardia, the heart beats more than 100 times a minute. Sinus tachycardia starts in a part of the heart called the sinus node. Sinus tachycardia may be harmless, or it may be a sign of a serious condition. What are the causes? This condition may be caused by:  Exercise or exertion.  A fever.  Pain.  Loss of body fluids (dehydration).  Severe bleeding (hemorrhage).  Anxiety and stress.  Certain substances, including: ? Alcohol. ? Caffeine. ? Tobacco and nicotine products. ? Cold medicines. ? Illegal drugs.  Medical conditions including: ? Heart disease. ? An infection. ? An overactive thyroid (hyperthyroidism). ? A lack of red blood cells (anemia). What are the signs or symptoms? Symptoms of this condition include:  A feeling that the heart is beating quickly (palpitations).  Suddenly noticing your heartbeat (cardiac awareness).  Dizziness.  Tiredness (fatigue).  Shortness of breath.  Chest pain.  Nausea.  Fainting. How is this diagnosed? This condition is diagnosed with:  A physical exam.  Other tests, such as: ? Blood tests. ? An electrocardiogram (ECG). This test measures the electrical activity of the heart. ? Ambulatory cardiac monitor. This records your heartbeats for 24 hours or more. You may be referred to a heart specialist (cardiologist). How is this treated? Treatment for this condition depends on the cause or the underlying condition. Treatment may involve:  Treating the underlying  condition.  Taking new medicines or changing your current medicines as told by your health care provider.  Making changes to your diet or lifestyle. Follow these instructions at home: Lifestyle  Do not use any products that contain nicotine or tobacco, such as cigarettes and e-cigarettes. If you need help quitting, ask your health care provider.  Do not use illegal drugs, such as cocaine.  Learn relaxation methods to help you when you get stressed or anxious. These include deep breathing.  Avoid caffeine or other stimulants.   Alcohol use  Do not drink alcohol if: ? Your health care provider tells you not to drink. ? You are pregnant, may be pregnant, or are planning to become pregnant.  If you drink alcohol, limit how much you have: ? 0-1 drink a day for women. ? 0-2 drinks a day for men.  Be aware of how much alcohol is in your drink. In the [place] one drink equals one typical bottle of beer (12 oz), one-half glass of wine (5 oz), or one shot of hard liquor (1 oz).   General instructions  Drink enough fluids to keep your urine pale yellow.  Take over-the-counter and prescription medicines only as told by your health care provider.  Keep all follow-up visits as told by your health care provider. This is important. Contact a health care provider if you have:  A fever.  Vomiting or diarrhea that does not go away. Get help right away if you:  Have pain in your chest, upper arms, jaw, or neck.  Become weak or dizzy.  Feel faint.  Have palpitations that do not go away. Summary  In  sinus tachycardia, the heart beats more than 100 times a minute.  Sinus tachycardia may be harmless, or it may be a sign of a serious condition.  Treatment for this condition depends on the cause or the underlying condition.  Get help right away if you have pain in your chest, upper arms, jaw, or neck. This information is not intended to replace advice given to you by your health care  provider. Make sure you discuss any questions you have with your health care provider. Document Revised: 12/21/2017 Document Reviewed: 12/21/2017 Elsevier Patient Education  2021 [employer].

## 2021-11-03 ENCOUNTER — Emergency Department
Admission: RE | Admit: 2021-11-03 | Discharge: 2021-11-03 | Disposition: A | Discharge status: Home / Self Care | Payer: 59 | Source: Ambulatory Visit | Attending: Family Medicine | Admitting: Family Medicine

## 2021-11-03 ENCOUNTER — Other Ambulatory Visit: Payer: Self-pay

## 2021-11-03 VITALS — BP 133/90 | HR 75 | Temp 98.9°F | Resp 15 | Ht 63.0 in | Wt 130.0 lb

## 2021-11-03 DIAGNOSIS — U071 COVID-19: Secondary | ICD-10-CM

## 2021-11-03 DIAGNOSIS — J101 Influenza due to other identified influenza virus with other respiratory manifestations: Secondary | ICD-10-CM

## 2021-11-03 LAB — POC SARS CORONAVIRUS 2 AG -  ED: SARS Coronavirus 2 Ag: POSITIVE — AB

## 2021-11-03 LAB — POCT INFLUENZA A/B
Influenza A, POC: NEGATIVE
Influenza B, POC: POSITIVE — AB

## 2021-11-03 MED ORDER — ACETAMINOPHEN 325 MG PO TABS
650.0000 mg | ORAL_TABLET | Freq: Once | ORAL | Status: AC
  Administered 2021-11-03: 13:00:00 650 mg via ORAL

## 2021-11-03 MED ORDER — OSELTAMIVIR PHOSPHATE 75 MG PO CAPS
75.0000 mg | ORAL_CAPSULE | Freq: Two times a day (BID) | ORAL | 0 refills | Status: AC
Start: 2021-11-03 — End: 2021-11-08

## 2021-11-03 MED ORDER — ONDANSETRON 4 MG PO TBDP: 4.0000 mg | ORAL_TABLET | Freq: Three times a day (TID) | ORAL | 0 refills | Status: AC | PRN

## 2021-11-03 NOTE — ED Provider Notes (Signed)
[facility]    CSN: [id number] Arrival date & time: 11/03/21  1153      History   Chief Complaint Chief Complaint  Patient presents with   Fever    HPI Richard Mcdowell is a 25 y.o. male.   Patient complains of 2 day history flu-like illness including myalgias, headache, fever/chills, fatigue, and cough.  Also has mild nasal congestion and sore throat.  Cough is non-productive and somewhat worse at night.  No pleuritic pain or shortness of breath.  He had several episodes of vomiting initially (resolved).  He became worse last night with onset sweats and fever to 102.   The history is provided by the patient.   History reviewed. No pertinent past medical history.  Patient Active Problem List   Diagnosis Date Noted   Tachycardia 04/20/2021   Acute cystitis without hematuria 04/16/2021   Generalized abdominal pain 04/16/2021   Non-intractable vomiting 04/16/2021   GAD (generalized anxiety disorder) 04/16/2021   Stress 04/16/2021   Sore throat 04/16/2021   Diarrhea 04/16/2021    History reviewed. No pertinent surgical history.     Home Medications    Prior to Admission medications   Medication Sig Start Date End Date Taking? Authorizing Provider  ondansetron (ZOFRAN-ODT) 4 MG disintegrating tablet Take 1 tablet (4 mg total) by mouth every 8 (eight) hours as needed for nausea or vomiting. Dissolve under tongue. 11/03/21  Yes Kandra Nicolas, MD  oseltamivir (TAMIFLU) 75 MG capsule Take 1 capsule (75 mg total) by mouth 2 (two) times daily for 5 days. 11/03/21 11/08/21 Yes Kandra Nicolas, MD  hydrOXYzine (ATARAX/VISTARIL) 10 MG tablet Take 1 tablet (10 mg total) by mouth 3 (three) times daily as needed. Patient not taking: Reported on 11/03/2021 04/15/21   Mayers, Loraine Grip, PA-C    Family History History reviewed. No pertinent family history.  Social History Social History   Tobacco Use   Smoking status: Never    Passive exposure: Never   Smokeless  tobacco: Never  Vaping Use   Vaping Use: Never used  Substance Use Topics   Alcohol use: No   Drug use: No     Allergies   Clindamycin   Review of Systems Review of Systems + sore throat + cough No pleuritic pain No wheezing + nasal congestion + post-nasal drainage No sinus pain/pressure No itchy/red eyes No earache No hemoptysis No SOB + fever, + chills/sweats No nausea   + vomiting (resolved) No abdominal pain No diarrhea No urinary symptoms No skin rash + fatigue + myalgias + headache Used OTC meds (Tylenol and ibuprofen) without relief   Physical Exam Triage Vital Signs ED Triage Vitals  Enc Vitals Group     BP 11/03/21 1309 133/90     Pulse Rate 11/03/21 1309 75     Resp 11/03/21 1309 15     Temp 11/03/21 1309 98.9 F (37.2 C)     Temp Source 11/03/21 1309 Oral     SpO2 11/03/21 1309 100 %     Weight 11/03/21 1310 130 lb (59 kg)     Height 11/03/21 1310 5\' 3"  (1.6 m)     Head Circumference --      Peak Flow --      Pain Score 11/03/21 1310 6     Pain Loc --      Pain Edu? --      Excl. in [place]? --    No data found.  Updated Vital Signs BP 133/90 (  BP Location: Left Arm)    Pulse 75    Temp 98.9 F (37.2 C) (Oral)    Resp 15    Ht 5\' 3"  (1.6 m)    Wt 59 kg    SpO2 100%    BMI 23.03 kg/m   Visual Acuity Right Eye Distance:   Left Eye Distance:   Bilateral Distance:    Right Eye Near:   Left Eye Near:    Bilateral Near:     Physical Exam Nursing notes and Vital Signs reviewed. Appearance:  Patient appears stated age, and in no acute distress Eyes:  Pupils are equal, round, and reactive to light and accomodation.  Extraocular movement is intact.  Conjunctivae are not inflamed  Ears:  Canals normal.  Tympanic membranes normal.  Nose:  Mildly congested turbinates.  No sinus tenderness.   Pharynx:  Normal Neck:  Supple.  Prominent but nontender lateral nodes.  Lungs:  Clear to auscultation.  Breath sounds are equal.  Moving air  well. Heart:  Regular rate and rhythm without murmurs, rubs, or gallops.  Abdomen:  Nontender without masses or hepatosplenomegaly.  Bowel sounds are present.  No CVA or flank tenderness.  Extremities:  No edema.  Skin:  No rash present.   UC Treatments / Results  Labs (all labs ordered are listed, but only abnormal results are displayed) Labs Reviewed  POCT INFLUENZA A/B - Abnormal; Notable for the following components:      Result Value   Influenza B, POC Positive (*)    All other components within normal limits  POC SARS CORONAVIRUS 2 AG -  ED - Abnormal; Notable for the following components:   SARS Coronavirus 2 Ag Positive (*)    All other components within normal limits    EKG   Radiology No results found.  Procedures Procedures (including critical care time)  Medications Ordered in UC Medications  acetaminophen (TYLENOL) tablet 650 mg (650 mg Oral Given 11/03/21 1323)    Initial Impression / Assessment and Plan / UC Course  I have reviewed the triage vital signs and the nursing notes.  Pertinent labs & imaging results that were available during my care of the patient were reviewed by me and considered in my medical decision making (see chart for details).    Begin Tamiflu. Followup with Family Doctor if not improved in one week.   Final Clinical Impressions(s) / UC Diagnoses   Final diagnoses:  Influenza B  COVID-19 virus infection     Discharge Instructions      Take plain guaifenesin (1200mg  extended release tabs such as Mucinex) twice daily, with plenty of water, for cough and congestion.  May add Pseudoephedrine (30mg , one or two every 4 to 6 hours) for sinus congestion.  Get adequate rest.   May use Afrin nasal spray (or generic oxymetazoline) each morning for about 5 days and then discontinue.  Also recommend using saline nasal spray several times daily and saline nasal irrigation (AYR is a common brand).  Use Flonase nasal spray each morning after  using Afrin nasal spray and saline nasal irrigation. Try warm salt water gargles for sore throat.  Stop all antihistamines for now, and other non-prescription cough/cold preparations. May take Ibuprofen 200mg , 4 tabs every 8 hours with food for body aches, headache, etc. May take Delsym Cough Suppressant ("12 Hour Cough Relief") at bedtime for nighttime cough.   Your COVID-19 test is positive.  Isolate yourself for five days from the time of your symptom  onset.  At the end of five days you may end isolation if your symptoms have cleared or improved, and you have not had a fever for 24 hours. At this time you should wear a mask for five more days when you are around others.  If symptoms become significantly worse during the night or over the weekend, proceed to the local emergency room.        ED Prescriptions     Medication Sig Dispense Auth. Provider   oseltamivir (TAMIFLU) 75 MG capsule Take 1 capsule (75 mg total) by mouth 2 (two) times daily for 5 days. 10 capsule Lattie Haw, MD   ondansetron (ZOFRAN-ODT) 4 MG disintegrating tablet Take 1 tablet (4 mg total) by mouth every 8 (eight) hours as needed for nausea or vomiting. Dissolve under tongue. 12 tablet Lattie Haw, MD         Lattie Haw, MD 11/05/21 [id number]

## 2021-11-03 NOTE — ED Triage Notes (Addendum)
Headache since yesterday w/ fever (102 last night) Emesis x 4 last night - none today - denies nausea Temp 100 this am - Ibuprofen 600mg  at 1000 Cough w/ body aches  No flu vaccine  COVID vaccine  [facility] [employer] [facility]

## 2021-11-03 NOTE — Discharge Instructions (Signed)
Take plain guaifenesin (1200mg  extended release tabs such as Mucinex) twice daily, with plenty of water, for cough and congestion.  May add Pseudoephedrine (30mg , one or two every 4 to 6 hours) for sinus congestion.  Get adequate rest.   May use Afrin nasal spray (or generic oxymetazoline) each morning for about 5 days and then discontinue.  Also recommend using saline nasal spray several times daily and saline nasal irrigation (AYR is a common brand).  Use Flonase nasal spray each morning after using Afrin nasal spray and saline nasal irrigation. Try warm salt water gargles for sore throat.  Stop all antihistamines for now, and other non-prescription cough/cold preparations. May take Ibuprofen 200mg , 4 tabs every 8 hours with food for body aches, headache, etc. May take Delsym Cough Suppressant ("12 Hour Cough Relief") at bedtime for nighttime cough.   Your COVID-19 test is positive.  Isolate yourself for five days from the time of your symptom onset.  At the end of five days you may end isolation if your symptoms have cleared or improved, and you have not had a fever for 24 hours. At this time you should wear a mask for five more days when you are around others.  If symptoms become significantly worse during the night or over the weekend, proceed to the local emergency room.
